# Patient Record
Sex: Female | Born: 1970 | Race: White | Hispanic: No | Marital: Married | Smoking: Never smoker
Health system: Southern US, Community
[De-identification: ages and names within clinical notes are randomized; demographics above are authoritative.]

## PROBLEM LIST (undated history)

## (undated) DIAGNOSIS — T7840XA Allergy, unspecified, initial encounter: Secondary | ICD-10-CM

## (undated) DIAGNOSIS — R011 Cardiac murmur, unspecified: Secondary | ICD-10-CM

## (undated) DIAGNOSIS — E559 Vitamin D deficiency, unspecified: Secondary | ICD-10-CM

## (undated) DIAGNOSIS — Z8619 Personal history of other infectious and parasitic diseases: Secondary | ICD-10-CM

## (undated) HISTORY — DX: Cardiac murmur, unspecified: R01.1

## (undated) HISTORY — DX: Vitamin D deficiency, unspecified: E55.9

## (undated) HISTORY — DX: Allergy, unspecified, initial encounter: T78.40XA

## (undated) HISTORY — DX: Personal history of other infectious and parasitic diseases: Z86.19

---

## 2015-04-23 LAB — HM MAMMOGRAPHY

## 2015-06-27 LAB — HM PAP SMEAR

## 2016-06-19 ENCOUNTER — Telehealth: Payer: Self-pay | Admitting: Family Medicine

## 2016-06-19 NOTE — Telephone Encounter (Signed)
Called Mrs. Ramires to schedule appt.  She said she was driving at the time and did not have her calendar available.  She will call back this afternoon to schedule appt.

## 2016-06-19 NOTE — Telephone Encounter (Signed)
Dr Gala RomneyBensimhon reached out to me asking if we would be able to see this pt as a new pt.  He cares for the husband and they just recently moved to town.  Please reach out to her and get her scheduled to establish.  Thanks!

## 2016-06-23 NOTE — Telephone Encounter (Signed)
Pt has been scheduled.  °

## 2016-06-25 ENCOUNTER — Telehealth: Payer: Self-pay | Admitting: Emergency Medicine

## 2016-06-25 ENCOUNTER — Encounter: Payer: Self-pay | Admitting: Emergency Medicine

## 2016-06-25 NOTE — Telephone Encounter (Signed)
Pre-Visit Call completed with patient and chart updated.   Pre-Visit Info documented in Specialty Comments under Snap[employer]ot.    

## 2016-06-26 ENCOUNTER — Encounter: Payer: Self-pay | Admitting: Family Medicine

## 2016-06-26 ENCOUNTER — Ambulatory Visit (INDEPENDENT_AMBULATORY_CARE_PROVIDER_SITE_OTHER): Payer: BLUE CROSS/BLUE SHIELD | Admitting: Family Medicine

## 2016-06-26 VITALS — BP 118/80 | HR 61 | Temp 98.0°F | Resp 16 | Ht 70.75 in | Wt 154.4 lb

## 2016-06-26 DIAGNOSIS — Z23 Encounter for immunization: Secondary | ICD-10-CM | POA: Diagnosis not present

## 2016-06-26 DIAGNOSIS — Z1231 Encounter for screening mammogram for malignant neoplasm of breast: Secondary | ICD-10-CM | POA: Diagnosis not present

## 2016-06-26 DIAGNOSIS — R51 Headache: Secondary | ICD-10-CM | POA: Diagnosis not present

## 2016-06-26 DIAGNOSIS — R011 Cardiac murmur, unspecified: Secondary | ICD-10-CM

## 2016-06-26 DIAGNOSIS — R519 Headache, unspecified: Secondary | ICD-10-CM

## 2016-06-26 LAB — CBC WITH DIFFERENTIAL/PLATELET
BASOS ABS: 0 10*3/uL (ref 0.0–0.1)
Basophils Relative: 0.5 % (ref 0.0–3.0)
EOS PCT: 1.8 % (ref 0.0–5.0)
Eosinophils Absolute: 0.1 10*3/uL (ref 0.0–0.7)
HEMATOCRIT: 41.3 % (ref 36.0–46.0)
Hemoglobin: 14.1 g/dL (ref 12.0–15.0)
LYMPHS PCT: 49.6 % — AB (ref 12.0–46.0)
Lymphs Abs: 2.6 10*3/uL (ref 0.7–4.0)
MCHC: 34 g/dL (ref 30.0–36.0)
MCV: 92.9 fl (ref 78.0–100.0)
MONOS PCT: 6.7 % (ref 3.0–12.0)
Monocytes Absolute: 0.4 10*3/uL (ref 0.1–1.0)
NEUTROS ABS: 2.2 10*3/uL (ref 1.4–7.7)
Neutrophils Relative %: 41.4 % — ABNORMAL LOW (ref 43.0–77.0)
Platelets: 227 10*3/uL (ref 150.0–400.0)
RBC: 4.44 Mil/uL (ref 3.87–5.11)
RDW: 14.3 % (ref 11.5–15.5)
WBC: 5.3 10*3/uL (ref 4.0–10.5)

## 2016-06-26 LAB — TSH: TSH: 3.26 u[IU]/mL (ref 0.35–4.50)

## 2016-06-26 LAB — BASIC METABOLIC PANEL
BUN: 15 mg/dL (ref 6–23)
CHLORIDE: 109 meq/L (ref 96–112)
CO2: 28 mEq/L (ref 19–32)
Calcium: 9.3 mg/dL (ref 8.4–10.5)
Creatinine, Ser: 0.98 mg/dL (ref 0.40–1.20)
GFR: 65.88 mL/min (ref >60.00)
GLUCOSE: 90 mg/dL (ref 70–99)
POTASSIUM: 5.3 meq/L — AB (ref 3.5–5.1)
SODIUM: 141 meq/L (ref 135–145)

## 2016-06-26 NOTE — Progress Notes (Signed)
Pre visit review using our clinic review tool, if applicable. No additional management support is needed unless otherwise documented below in the visit note. 

## 2016-06-26 NOTE — Progress Notes (Signed)
   Subjective:    Patient ID: Michele Russo, female    DOB: October 21, 1970, 45 y.o.   MRN: [id number]  HPI New to establish.  Previous MD- in [place]  Heart murmur- pt was told that she has a heart murmur.  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  Denies CP, SOB.  Has never had an ECHO.  Doing bootcamp w/o difficulty.  No swelling of hands/feet  Head/neck pain- pt describes 'a numbing kind of pain' most notable when waking up in the AM.  Was told by previous MD that 'it's probably fibromyalgia'.  sxs will occur throughout the day.  Pt does not take tylenol or ibuprofen.  sxs started 'years' ago.  Head is not TTP.  Denies lightning or shooting pains.  'it's not a headache'.  No N/V, visual changes.  sxs improve w/ increased hydration.  Review of Systems For ROS see HPI     Objective:   Physical Exam  Constitutional: She is oriented to person, place, and time. She appears well-developed and well-nourished. No distress.  HENT:  Head: Normocephalic and atraumatic.  Eyes: Conjunctivae and EOM are normal. Pupils are equal, round, and reactive to light.  Neck: Normal range of motion. Neck supple. No thyromegaly present.  Cardiovascular: Normal rate, regular rhythm, normal heart sounds and intact distal pulses.   No murmur heard. Pulmonary/Chest: Effort normal and breath sounds normal. No respiratory distress.  Abdominal: Soft. She exhibits no distension. There is no tenderness.  Musculoskeletal: She exhibits no edema.  Lymphadenopathy:    She has no cervical adenopathy.  Neurological: She is alert and oriented to person, place, and time. She has normal reflexes. No cranial nerve deficit. Coordination normal.  Skin: Skin is warm and dry.  Psychiatric: She has a normal mood and affect. Her behavior is normal.  Vitals reviewed.         Assessment & Plan:  Heart murmur- pt was told she had this previously but never had a work up.  I do not hear any murmur today and I listened w/ her  both sitting and lying down.  Asymptomatic.  Check labs to r/o anemia, thyroid, BMP as possible cause of murmur but no further evaluation needed  Pressure in head- pt reports this has been going on for years and is most notable when she wakes.  Reports that sxs improve w/ increased water intake.  Due to fact it occurs when waking, will get xray to assess cervical spine and see if there are any degenerative changes or positional issues.  Normal neuro exam today.  Encouraged pt to increase her water intake and see if sxs improve.  Reviewed supportive care and red flags that should prompt return.  Pt expressed understanding and is in agreement w/ plan.

## 2016-06-26 NOTE — Patient Instructions (Addendum)
Schedule your complete medical physical in 6 months We'll notify you of today's lab results and make any changes if needed We'll call you with your mammogram appt Go [address] (across from [facility]) to get your xray done (no appt needed- enter building and go to ground floor) Keep up the good work on healthy diet and regular exercise- you look great!!! Increase your water intake and monitor if head pressure improves Call with any questions or concerns Welcome!!  We're glad to have you!!!

## 2016-06-27 ENCOUNTER — Encounter: Payer: Self-pay | Admitting: General Practice

## 2016-07-07 ENCOUNTER — Encounter: Payer: Self-pay | Admitting: General Practice

## 2016-07-14 ENCOUNTER — Ambulatory Visit (INDEPENDENT_AMBULATORY_CARE_PROVIDER_SITE_OTHER)
Admission: RE | Admit: 2016-07-14 | Discharge: 2016-07-14 | Disposition: A | Discharge status: Home / Self Care | Payer: BLUE CROSS/BLUE SHIELD | Source: Ambulatory Visit | Attending: Family Medicine | Admitting: Family Medicine

## 2016-07-14 ENCOUNTER — Encounter: Payer: Self-pay | Admitting: Obstetrics and Gynecology

## 2016-07-14 ENCOUNTER — Ambulatory Visit (INDEPENDENT_AMBULATORY_CARE_PROVIDER_SITE_OTHER): Payer: BLUE CROSS/BLUE SHIELD | Admitting: Obstetrics and Gynecology

## 2016-07-14 VITALS — BP 112/60 | HR 60 | Resp 14 | Ht 70.5 in | Wt 158.0 lb

## 2016-07-14 DIAGNOSIS — Z0000 Encounter for general adult medical examination without abnormal findings: Secondary | ICD-10-CM | POA: Diagnosis not present

## 2016-07-14 DIAGNOSIS — Z124 Encounter for screening for malignant neoplasm of cervix: Secondary | ICD-10-CM

## 2016-07-14 DIAGNOSIS — E559 Vitamin D deficiency, unspecified: Secondary | ICD-10-CM | POA: Diagnosis not present

## 2016-07-14 DIAGNOSIS — R51 Headache: Secondary | ICD-10-CM | POA: Diagnosis not present

## 2016-07-14 DIAGNOSIS — Z01419 Encounter for gynecological examination (general) (routine) without abnormal findings: Secondary | ICD-10-CM | POA: Diagnosis not present

## 2016-07-14 DIAGNOSIS — R519 Headache, unspecified: Secondary | ICD-10-CM

## 2016-07-14 LAB — LIPID PANEL
CHOL/HDL RATIO: 2.4 ratio (ref <=5.0)
CHOLESTEROL: 158 mg/dL (ref 125–200)
HDL: 65 mg/dL (ref >=46)
LDL CALC: 70 mg/dL (ref <130)
TRIGLYCERIDES: 116 mg/dL (ref <150)
VLDL: 23 mg/dL (ref <30)

## 2016-07-14 LAB — CBC
HCT: 42.2 % (ref 35.0–45.0)
Hemoglobin: 13.8 g/dL (ref 11.7–15.5)
MCH: 31.3 pg (ref 27.0–33.0)
MCHC: 32.7 g/dL (ref 32.0–36.0)
MCV: 95.7 fL (ref 80.0–100.0)
MPV: 11.2 fL (ref 7.5–12.5)
PLATELETS: 272 10*3/uL (ref 140–400)
RBC: 4.41 MIL/uL (ref 3.80–5.10)
RDW: 14 % (ref 11.0–15.0)
WBC: 7.4 10*3/uL (ref 3.8–10.8)

## 2016-07-14 LAB — COMPREHENSIVE METABOLIC PANEL
ALBUMIN: 4.3 g/dL (ref 3.6–5.1)
ALT: 17 U/L (ref 6–29)
AST: 15 U/L (ref 10–30)
Alkaline Phosphatase: 56 U/L (ref 33–115)
BUN: 10 mg/dL (ref 7–25)
CHLORIDE: 103 mmol/L (ref 98–110)
CO2: 25 mmol/L (ref 20–31)
Calcium: 9.5 mg/dL (ref 8.6–10.2)
Creat: 0.89 mg/dL (ref 0.50–1.10)
Glucose, Bld: 80 mg/dL (ref 65–99)
POTASSIUM: 4.6 mmol/L (ref 3.5–5.3)
SODIUM: 138 mmol/L (ref 135–146)
TOTAL PROTEIN: 7.1 g/dL (ref 6.1–8.1)
Total Bilirubin: 0.6 mg/dL (ref 0.2–1.2)

## 2016-07-14 LAB — HM MAMMOGRAPHY

## 2016-07-14 NOTE — Patient Instructions (Signed)

## 2016-07-14 NOTE — Progress Notes (Signed)
45 y.o. [id number] MarriedCaucasianF here for annual exam.   Period Cycle (Days): 28 Period Duration (Days): 4-5 days  Period Pattern: Regular Menstrual Flow: Moderate, Heavy Menstrual Control: Tampon Dysmenorrhea: None  Saturates a super tampon in 3 hours. Slight cramping.  Sexually active, vasectomy for contraception. No dyspareunia.   Patient's last menstrual period was 07/12/2016.          Sexually active: Yes.    The current method of family planning is vasectomy.    Exercising: Yes.    boot camp/ weights/ cardio Smoker:  no  Health Maintenance: Pap:  02/2015 WNL per patient  History of abnormal Pap:  no MMG:  02/2015 - U/S WNL Colonoscopy:  Never BMD:   Never TDaP:  06-26-09 Gardasil: N/A   reports that she has never smoked. She has never used smokeless tobacco. She reports that she drinks about 0.6 oz of alcohol per week . She reports that she does not use drugs.  Moved here 3 months ago. Oldest daughter just got married, younger daughter is in college. She is currently working. Quit her job when she moved here.   Past Medical History:  Diagnosis Date  . Allergy   . Heart murmur   . History of chicken pox   . History of UTI   Vit D def  Past Surgical History:  Procedure Laterality Date  . CESAREAN SECTION    x1, first was a normal SVD  No current outpatient prescriptions on file.   No current facility-administered medications for this visit.     Family History  Problem Relation Age of Onset  . Osteoporosis Maternal Grandmother   . Heart disease Maternal Grandfather   . Osteoporosis Paternal Grandmother   . Alcohol abuse Father   . Liver disease Father     Review of Systems  Constitutional: Negative.   HENT: Negative.   Eyes: Negative.   Respiratory: Negative.   Cardiovascular: Negative.   Gastrointestinal: Negative.   Endocrine: Negative.   Genitourinary: Negative.   Musculoskeletal: Negative.   Skin: Negative.   Allergic/Immunologic: Negative.    Neurological: Negative.   Psychiatric/Behavioral: Negative.     Exam:   BP 112/60 (BP Location: Right Arm, Patient Position: Sitting, Cuff Size: Normal)   Pulse 60   Resp 14   Ht 5' 10.5" (1.791 m)   Wt 158 lb (71.7 kg)   LMP 07/12/2016   BMI 22.35 kg/m   Weight change: @WEIGHTCHANGE @ Height:   Height: 5' 10.5" (179.1 cm)  Ht Readings from Last 3 Encounters:  07/14/16 5' 10.5" (1.791 m)  06/26/16 5' 10.75" (1.797 m)    General appearance: alert, cooperative and appears stated age Head: Normocephalic, without obvious abnormality, atraumatic Neck: no adenopathy, supple, symmetrical, trachea midline and thyroid normal to inspection and palpation Lungs: clear to auscultation bilaterally Breasts: normal appearance, no masses or tenderness Heart: regular rate and rhythm Abdomen: soft, non-tender; bowel sounds normal; no masses,  no organomegaly Extremities: extremities normal, atraumatic, no cyanosis or edema Skin: Skin color, texture, turgor normal. No rashes or lesions. Slight increase in pigmentation lateral to the breast bilaterally and under the left breast.  Lymph nodes: Cervical, supraclavicular, and axillary nodes normal. No abnormal inguinal nodes palpated Neurologic: Grossly normal   Pelvic: External genitalia:  no lesions              Urethra:  normal appearing urethra with no masses, tenderness or lesions              Bartholins  and Skenes: normal                 Vagina: normal appearing vagina with normal color and discharge, no lesions              Cervix: no lesions               Bimanual Exam:  Uterus:  normal size, contour, position, consistency, mobility, non-tender              Adnexa: no mass, fullness, tenderness               Rectovaginal: Confirms               Anus:  normal sphincter tone, no lesions  Chaperone was present for exam.  A:  Well Woman with normal exam  H/O vit d def  P:   Mammogram today  Pap with hpv  Screening labs  today  Discussed breast self exam  Discussed calcium and vit D intake

## 2016-07-15 LAB — VITAMIN D 25 HYDROXY (VIT D DEFICIENCY, FRACTURES): Vit D, 25-Hydroxy: 23 ng/mL — ABNORMAL LOW (ref 30–100)

## 2016-07-16 LAB — IPS PAP TEST WITH HPV

## 2016-07-24 ENCOUNTER — Encounter: Payer: Self-pay | Admitting: General Practice

## 2016-08-21 ENCOUNTER — Telehealth: Payer: Self-pay | Admitting: Family Medicine

## 2016-08-21 NOTE — Telephone Encounter (Signed)
Patient notified of PCP recommendations and is agreement and expresses an understanding.  

## 2016-08-21 NOTE — Telephone Encounter (Signed)
She can either take 3-4 ibuprofen every 8 hrs or 2 aleve twice daily- either way, take w/ food and we'll evaluate her tomorrow

## 2016-08-21 NOTE — Telephone Encounter (Signed)
Pt states that she was moving some furniture and pulled her back. Pt is unsure on what she needs to do. I have scheduled her for tomorrow morning, but pt would like to know what she can do for tonight and wants a call back.

## 2016-08-22 ENCOUNTER — Encounter: Payer: Self-pay | Admitting: Family Medicine

## 2016-08-22 ENCOUNTER — Ambulatory Visit (INDEPENDENT_AMBULATORY_CARE_PROVIDER_SITE_OTHER): Payer: BLUE CROSS/BLUE SHIELD | Admitting: Family Medicine

## 2016-08-22 VITALS — BP 116/72 | HR 64 | Temp 98.1°F | Resp 17 | Ht 71.0 in | Wt 157.1 lb

## 2016-08-22 DIAGNOSIS — M545 Low back pain, unspecified: Secondary | ICD-10-CM

## 2016-08-22 MED ORDER — CYCLOBENZAPRINE HCL 10 MG PO TABS: 10.0000 mg | ORAL_TABLET | Freq: Three times a day (TID) | ORAL | 0 refills | Status: DC | PRN

## 2016-08-22 MED ORDER — PREDNISONE 10 MG PO TABS: ORAL_TABLET | ORAL | 0 refills | Status: DC

## 2016-08-22 NOTE — Progress Notes (Signed)
   Subjective:    Patient ID: Michele Russo, female    DOB: 1970/10/22, 45 y.o.   MRN: [id number]  HPI LBP- 2 days ago was moving 2 recliners in the house and had no difficulty.  Next morning she bent over to pick something up off the floor and 'i could hear my back snap, crackle, pop'.  It dropped pt to her knees.  + nausea.  Pt has to stand and sit very upright for pain relief.  Used heating pad yesterday.  Did not take medication b/c she prefers not to.  Slept w/ legs elevated.  Again had nausea when she got out of bed today.     Review of Systems For ROS see HPI     Objective:   Physical Exam  Constitutional: She is oriented to person, place, and time. She appears well-developed and well-nourished. No distress.  HENT:  Head: Normocephalic and atraumatic.  Cardiovascular: Intact distal pulses.   Musculoskeletal: She exhibits tenderness (TTP over PSIS bilaterally.  no spinal/midline TTP.  able to flex and extend although gingerly.  pain w/ rotation of spine).  Neurological: She is alert and oriented to person, place, and time. She has normal reflexes. Coordination normal.  Skin: Skin is warm and dry. No rash noted. No erythema.  Psychiatric: She has a normal mood and affect. Her behavior is normal. Thought content normal.  Vitals reviewed.         Assessment & Plan:  LBP- new.  Suspect muscular pain given lack of red flags on hx or PE.  Start Prednisone taper and flexeril prn.  Reviewed supportive care and red flags that should prompt return.  Pt expressed understanding and is in agreement w/ plan.

## 2016-08-22 NOTE — Patient Instructions (Signed)
Follow up as needed Start the Prednisone as directed- 3 pills at the same time x3 days, then 2 pills x3 days, and then 1 pill daily (take w/ food) HEAT! Use the flexeril at bedtime Try and do some gentle stretching to prevent worsening stiffness Call with any questions or concerns Hang in there! Happy Holidays!!!

## 2016-08-22 NOTE — Progress Notes (Signed)
Pre visit review using our clinic review tool, if applicable. No additional management support is needed unless otherwise documented below in the visit note. 

## 2016-08-25 ENCOUNTER — Telehealth: Payer: Self-pay | Admitting: Family Medicine

## 2016-08-25 NOTE — Telephone Encounter (Signed)
Patient notified of PCP recommendations and is agreement and expresses an understanding.  

## 2016-08-25 NOTE — Telephone Encounter (Signed)
She was given prednisone 3 days ago on 11/17 and should have 6 days left.  These are the best available anti-inflammatories.  Add tylenol as needed.  Take the Flexeril for spasm (may cause drowsiness)

## 2016-08-25 NOTE — Telephone Encounter (Signed)
Pt states that she has pulled on her back again [place] and asking if there is some kind of anti inflammatory that she could take during the day to help with the pain, [facility] in [place]

## 2016-09-02 ENCOUNTER — Telehealth: Payer: Self-pay | Admitting: Family Medicine

## 2016-09-02 DIAGNOSIS — M545 Low back pain: Secondary | ICD-10-CM

## 2016-09-02 NOTE — Telephone Encounter (Signed)
Recommend either PT referral for home exercise program or Ortho (Ramos)

## 2016-09-02 NOTE — Telephone Encounter (Signed)
Pt states that she is still having issues with her back, thinking that maybe its a pinch nerve and asking if there is any exercises that she could do.

## 2016-09-02 NOTE — Telephone Encounter (Signed)
Spoke with pt and referral to Dr> Ramos placed at her preference.

## 2016-10-29 ENCOUNTER — Telehealth: Payer: Self-pay | Admitting: Family Medicine

## 2016-10-29 NOTE — Telephone Encounter (Signed)
Patient Name: Michele Russo DOB: 03-02-1971 Initial Comment Caller states she thinks she has tennis elbow, and now the pain has moved into her shoulder. Right arm. She does get a tightness in her chest. Nurse Assessment Nurse: Charna Elizabethrumbull, RN, Lynden Angathy Date/Time (Eastern Time): 10/29/2016 9:07:16 AM Confirm and document reason for call. If symptomatic, describe symptoms. ---Caller states she has had chest tightness that goes into her right shoulder/arm on and off the past 3-4 weeks that is present again today (rated as a 4 on the 1 to 10 scale). No severe breathing difficulty. Alert and responsive. No known injury. Does the patient have any new or worsening symptoms? ---Yes Will a triage be completed? ---Yes Related visit to physician within the last 2 weeks? ---No Does the PT have any chronic conditions? (i.e. diabetes, asthma, etc.) ---Yes List chronic conditions. ---Tennis Elbow?, Heart Murmur?, Is the patient pregnant or possibly pregnant? (Ask all females between the ages of 8712-55) ---No Is this a behavioral health or substance abuse call? ---No Guidelines Guideline Title Affirmed Question Affirmed Notes Chest Pain [1] Intermittent chest pain or "angina" AND [2] increasing in severity or frequency (Exception: pains lasting a few seconds) Final Disposition User Go to ED Now Charna Elizabethrumbull, RN, [employer]athy Comments [name] declined the Go to ER disposition. Reinforced the Go to ER disposition. [name] Batheresa asks to see Dr. Beverely Lowabori. Called the office and Sue Lushndrea states that MD or MD assistant will call her back. [name] notified. Referrals GO TO FACILITY REFUSED Disagree/Comply: Disagree Disagree/Comply Reason: Disagree with instructions

## 2016-10-29 NOTE — Telephone Encounter (Signed)
Michele MessierKathy with [facility] calling to notify pcp that patient has declined their instructions to report to ER for chest tightness with shoulder and arm pain.

## 2016-10-29 NOTE — Telephone Encounter (Signed)
Spoke with patient regarding symptoms. Patient reports occasional tightness in chest x 3-4 weeks at night while resting. Episodes last less than 30 seconds, denies shortness of breath. Patient is training for a marathon, therefore is running 6-8 miles/day, denies chest pain/discomfort while running. Patient has also experienced right elbow/arm discomfort x 2 months, no history of injury. Patient does not feel this is an emergent situation and is requesting an appointment with PCP for evaluation. Appointment scheduled for 10/30/2016 with PCP.

## 2016-10-29 NOTE — Telephone Encounter (Signed)
Can you please call pt and find out if it is more chest pain (heart issues) or the tennis elbow that they also talk about.

## 2016-10-30 ENCOUNTER — Encounter: Payer: Self-pay | Admitting: Family Medicine

## 2016-10-30 ENCOUNTER — Ambulatory Visit (INDEPENDENT_AMBULATORY_CARE_PROVIDER_SITE_OTHER): Payer: BLUE CROSS/BLUE SHIELD | Admitting: Family Medicine

## 2016-10-30 VITALS — BP 118/81 | HR 69 | Temp 98.1°F | Resp 16 | Ht 71.0 in | Wt 160.5 lb

## 2016-10-30 DIAGNOSIS — R0789 Other chest pain: Secondary | ICD-10-CM | POA: Diagnosis not present

## 2016-10-30 DIAGNOSIS — M7711 Lateral epicondylitis, right elbow: Secondary | ICD-10-CM | POA: Diagnosis not present

## 2016-10-30 MED ORDER — CYCLOBENZAPRINE HCL 10 MG PO TABS
10.0000 mg | ORAL_TABLET | Freq: Three times a day (TID) | ORAL | 0 refills | Status: DC | PRN
Start: 2016-10-30 — End: 2016-12-22

## 2016-10-30 MED ORDER — PREDNISONE 10 MG PO TABS: ORAL_TABLET | ORAL | 0 refills | Status: DC

## 2016-10-30 NOTE — Patient Instructions (Signed)
Follow up by phone or MyChart if no better in 7-10 days or worsening Start the Prednisone as directed- take w/ food Use the Flexeril prior to bed- will cause drowsiness ICE the elbow!!! Make sure you stretch your arms and chest prior to running Call with any questions or concerns- especially if we need the ortho referral Hang in there!!

## 2016-10-30 NOTE — Progress Notes (Signed)
Pre visit review using our clinic review tool, if applicable. No additional management support is needed unless otherwise documented below in the visit note. 

## 2016-10-30 NOTE — Progress Notes (Signed)
   Subjective:    Patient ID: Michele Russo, female    DOB: [birth year], 46 y.o.   MRN: [id number]  HPI Chest pain- sxs started ~4 weeks ago.  Described as a tightness of upper chest across both sides.  Occurs at night while resting.  No precipitating factors.  No TTP, no pain w/ exertion (training for marathon), no SOB.  Hx of cardiac murmur.  No discomfort after eating.  R arm pain- starts at elbow, radiates down medial aspect of arm.  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  Pain is now radiating up into shoulder and down R side of chest   Review of Systems For ROS see HPI     Objective:   Physical Exam  Constitutional: She is oriented to person, place, and time. She appears well-developed and well-nourished. No distress.  HENT:  Head: Normocephalic and atraumatic.  Eyes: Conjunctivae and EOM are normal. Pupils are equal, round, and reactive to light.  Neck: Normal range of motion. Neck supple. No thyromegaly present.  Cardiovascular: Normal rate, regular rhythm, normal heart sounds and intact distal pulses.   No murmur heard. Pulmonary/Chest: Effort normal and breath sounds normal. No respiratory distress.  Abdominal: Soft. She exhibits no distension. There is no tenderness.  Musculoskeletal: She exhibits tenderness (TTP over R lateral epicondyle and insertion of pecs bilaterally). She exhibits no edema.  Lymphadenopathy:    She has no cervical adenopathy.  Neurological: She is alert and oriented to person, place, and time.  Skin: Skin is warm and dry.  Psychiatric: She has a normal mood and affect. Her behavior is normal.  Vitals reviewed.         Assessment & Plan:  R lateral epicondylitis- new.  Pt's R arm pain and PE consistent w/ inflammation.  Start Prednisone as pt is allergic to NSAIDs.  Reviewed dx, need for rest and icing.  If no improvement will need ortho referral.  Pt expressed understanding and is in agreement w/ plan.   Chest pain- new.  Pt's EKG  WNL and her pain does not occur w/ exertion.  Her sxs are more musculoskeletal w/ pain of pec insertion bilaterally and across upper chest in bandlike fashion.  Prednisone should help.  I suspect this is due to her recent increase in mileage.  Reviewed supportive care and red flags that should prompt return.  Pt expressed understanding and is in agreement w/ plan.

## 2016-11-03 ENCOUNTER — Telehealth: Payer: Self-pay | Admitting: Family Medicine

## 2016-11-03 NOTE — Telephone Encounter (Signed)
Mucinex DM will help w/ cough and congestion Daily Claritin/Zyrtec will help w/ possible allergy component

## 2016-11-03 NOTE — Telephone Encounter (Signed)
Pt states that she is having some sinus issues and asking what could take otc to help take care of this.

## 2016-11-03 NOTE — Telephone Encounter (Signed)
Patient notified of PCP recommendations and is agreement and expresses an understanding.  

## 2016-12-22 ENCOUNTER — Encounter: Payer: Self-pay | Admitting: Family Medicine

## 2016-12-22 ENCOUNTER — Ambulatory Visit (INDEPENDENT_AMBULATORY_CARE_PROVIDER_SITE_OTHER): Payer: BLUE CROSS/BLUE SHIELD | Admitting: Family Medicine

## 2016-12-22 DIAGNOSIS — Z0000 Encounter for general adult medical examination without abnormal findings: Secondary | ICD-10-CM | POA: Insufficient documentation

## 2016-12-22 LAB — BASIC METABOLIC PANEL
BUN: 12 mg/dL (ref 6–23)
CALCIUM: 9.6 mg/dL (ref 8.4–10.5)
CO2: 27 mEq/L (ref 19–32)
Chloride: 105 mEq/L (ref 96–112)
Creatinine, Ser: 0.83 mg/dL (ref 0.40–1.20)
GFR: 79.61 mL/min (ref >60.00)
GLUCOSE: 82 mg/dL (ref 70–99)
Potassium: 4.6 mEq/L (ref 3.5–5.1)
SODIUM: 139 meq/L (ref 135–145)

## 2016-12-22 LAB — LIPID PANEL
Cholesterol: 134 mg/dL (ref 0–200)
HDL: 71 mg/dL (ref >39.00)
LDL CALC: 49 mg/dL (ref 0–99)
NONHDL: 63.49
Total CHOL/HDL Ratio: 2
Triglycerides: 70 mg/dL (ref 0.0–149.0)
VLDL: 14 mg/dL (ref 0.0–40.0)

## 2016-12-22 LAB — HEPATIC FUNCTION PANEL
ALT: 12 U/L (ref 0–35)
AST: 14 U/L (ref 0–37)
Albumin: 4.4 g/dL (ref 3.5–5.2)
Alkaline Phosphatase: 46 U/L (ref 39–117)
BILIRUBIN TOTAL: 0.6 mg/dL (ref 0.2–1.2)
Bilirubin, Direct: 0.1 mg/dL (ref 0.0–0.3)
Total Protein: 6.9 g/dL (ref 6.0–8.3)

## 2016-12-22 LAB — CBC WITH DIFFERENTIAL/PLATELET
BASOS PCT: 0.7 % (ref 0.0–3.0)
Basophils Absolute: 0 10*3/uL (ref 0.0–0.1)
EOS ABS: 0.3 10*3/uL (ref 0.0–0.7)
Eosinophils Relative: 5.2 % — ABNORMAL HIGH (ref 0.0–5.0)
HEMATOCRIT: 40.6 % (ref 36.0–46.0)
HEMOGLOBIN: 13.3 g/dL (ref 12.0–15.0)
Lymphocytes Relative: 41 % (ref 12.0–46.0)
Lymphs Abs: 2.1 10*3/uL (ref 0.7–4.0)
MCHC: 32.7 g/dL (ref 30.0–36.0)
MCV: 94.8 fl (ref 78.0–100.0)
Monocytes Absolute: 0.4 10*3/uL (ref 0.1–1.0)
Monocytes Relative: 7.6 % (ref 3.0–12.0)
Neutro Abs: 2.4 10*3/uL (ref 1.4–7.7)
Neutrophils Relative %: 45.5 % (ref 43.0–77.0)
Platelets: 224 10*3/uL (ref 150.0–400.0)
RBC: 4.28 Mil/uL (ref 3.87–5.11)
RDW: 14.4 % (ref 11.5–15.5)
WBC: 5.2 10*3/uL (ref 4.0–10.5)

## 2016-12-22 LAB — VITAMIN D 25 HYDROXY (VIT D DEFICIENCY, FRACTURES): VITD: 23.8 ng/mL — ABNORMAL LOW (ref 30.00–100.00)

## 2016-12-22 LAB — TSH: TSH: 6.16 u[IU]/mL — ABNORMAL HIGH (ref 0.35–4.50)

## 2016-12-22 NOTE — Assessment & Plan Note (Signed)
Pt's PE WNL.  UTD on GYN and immunizations.  Check labs.  Anticipatory guidance provided.  

## 2016-12-22 NOTE — Patient Instructions (Signed)
Follow up in 1 year or as needed We'll notify you of your lab results and make any changes if needed Continue to work on healthy diet and regular exercise- you look great! Call with any questions or concerns Happy Spring!!! 

## 2016-12-22 NOTE — Progress Notes (Signed)
   Subjective:    Patient ID: Michele Russo, female    DOB: January 16, 1971, 46 y.o.   MRN: [id number]  HPI CPE- UTD on pap, mammo.  UTD on Flu, Tdap.  No concerns today.   Review of Systems Patient reports no vision/ hearing changes, adenopathy,fever, weight change,  persistant/recurrent hoarseness , swallowing issues, chest pain, palpitations, edema, persistant/recurrent cough, hemoptysis, dyspnea (rest/exertional/paroxysmal nocturnal), gastrointestinal bleeding (melena, rectal bleeding), abdominal pain, significant heartburn, bowel changes, GU symptoms (dysuria, hematuria, incontinence), Gyn symptoms (abnormal  bleeding, pain),  syncope, focal weakness, memory loss, numbness & tingling, skin/hair/nail changes, abnormal bruising or bleeding, anxiety, or depression.     Objective:   Physical Exam General Appearance:    Alert, cooperative, no distress, appears stated age  Head:    Normocephalic, without obvious abnormality, atraumatic  Eyes:    PERRL, conjunctiva/corneas clear, EOM's intact, fundi    benign, both eyes  Ears:    Normal TM's and external ear canals, both ears  Nose:   Nares normal, septum midline, mucosa normal, no drainage    or sinus tenderness  Throat:   Lips, mucosa, and tongue normal; teeth and gums normal  Neck:   Supple, symmetrical, trachea midline, no adenopathy;    Thyroid: no enlargement/tenderness/nodules  Back:     Symmetric, no curvature, ROM normal, no CVA tenderness  Lungs:     Clear to auscultation bilaterally, respirations unlabored  Chest Wall:    No tenderness or deformity   Heart:    Regular rate and rhythm, S1 and S2 normal, no murmur, rub   or gallop  Breast Exam:    Deferred to GYN  Abdomen:     Soft, non-tender, bowel sounds active all four quadrants,    no masses, no organomegaly  Genitalia:    Deferred to GYN  Rectal:    Extremities:   Extremities normal, atraumatic, no cyanosis or edema  Pulses:   2+ and symmetric all extremities  Skin:   Skin  color, texture, turgor normal, no rashes or lesions  Lymph nodes:   Cervical, supraclavicular, and axillary nodes normal  Neurologic:   CNII-XII intact, normal strength, sensation and reflexes    throughout          Assessment & Plan:

## 2016-12-22 NOTE — Progress Notes (Signed)
Pre visit review using our clinic review tool, if applicable. No additional management support is needed unless otherwise documented below in the visit note. 

## 2016-12-23 ENCOUNTER — Other Ambulatory Visit: Payer: Self-pay | Admitting: General Practice

## 2016-12-23 DIAGNOSIS — R7989 Other specified abnormal findings of blood chemistry: Secondary | ICD-10-CM

## 2016-12-23 MED ORDER — VITAMIN D (ERGOCALCIFEROL) 1.25 MG (50000 UNIT) PO CAPS: 50000.0000 [IU] | ORAL_CAPSULE | ORAL | 0 refills | Status: DC

## 2016-12-23 MED ORDER — LEVOTHYROXINE SODIUM 50 MCG PO TABS: 50.0000 ug | ORAL_TABLET | Freq: Every day | ORAL | 1 refills | Status: DC

## 2017-01-26 ENCOUNTER — Other Ambulatory Visit (INDEPENDENT_AMBULATORY_CARE_PROVIDER_SITE_OTHER): Payer: BLUE CROSS/BLUE SHIELD

## 2017-01-26 ENCOUNTER — Telehealth: Payer: Self-pay | Admitting: Family Medicine

## 2017-01-26 DIAGNOSIS — R7989 Other specified abnormal findings of blood chemistry: Secondary | ICD-10-CM

## 2017-01-26 DIAGNOSIS — R946 Abnormal results of thyroid function studies: Secondary | ICD-10-CM

## 2017-01-26 LAB — TSH: TSH: 2.25 u[IU]/mL (ref 0.35–4.50)

## 2017-01-26 NOTE — Telephone Encounter (Signed)
That would be great!  OTC Glucosamine is perfectly safe

## 2017-01-26 NOTE — Telephone Encounter (Signed)
Called pt and left a detailed message to inform that PCP agrees with taking the glucosamine.

## 2017-01-26 NOTE — Telephone Encounter (Signed)
Patient states she has been experiencing joint pain since starting levothyroxine (SYNTHROID, LEVOTHROID) 50 MCG tablet.  She is considering starting otc glucosamin supplements to help with the pain.  She wants to know if pcp thinks this would be ok.

## 2017-01-27 ENCOUNTER — Encounter: Payer: Self-pay | Admitting: Family Medicine

## 2017-02-23 ENCOUNTER — Encounter: Payer: Self-pay | Admitting: Family Medicine

## 2017-02-23 ENCOUNTER — Ambulatory Visit (INDEPENDENT_AMBULATORY_CARE_PROVIDER_SITE_OTHER): Payer: BLUE CROSS/BLUE SHIELD | Admitting: Family Medicine

## 2017-02-23 VITALS — BP 120/80 | HR 71 | Temp 98.5°F | Resp 16 | Ht 71.0 in | Wt 159.1 lb

## 2017-02-23 DIAGNOSIS — L439 Lichen planus, unspecified: Secondary | ICD-10-CM | POA: Diagnosis not present

## 2017-02-23 DIAGNOSIS — J301 Allergic rhinitis due to pollen: Secondary | ICD-10-CM

## 2017-02-23 NOTE — Progress Notes (Signed)
Pre visit review using our clinic review tool, if applicable. No additional management support is needed unless otherwise documented below in the visit note. 

## 2017-02-23 NOTE — Assessment & Plan Note (Signed)
New.  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  Will check ANA and determine if rheum referral is needed.  Pt expressed understanding and is in agreement w/ plan.

## 2017-02-23 NOTE — Patient Instructions (Signed)
Follow up as needed Start daily Zyrtec to help w/ postnasal drip and allergy congestion/cough Drink plenty of fluids REST! Mucinex DM will help w/ cough and congestion Call with any questions or concerns Hang in there!!!

## 2017-02-23 NOTE — Progress Notes (Signed)
   Subjective:    Patient ID: Michele Russo, female    DOB: Oct 31, 1970, 46 y.o.   MRN: [id number]  HPI URI- pt was visiting [place] last week and developed watery eyes, sore throat, constantly clearing her throat.  + cough- 'all night long'.  Attempted to sleep upright due to drainage.  Pt has sensation of water in L ear.  Pt reports 'I feel fine'.  Pt is not taking any OTC medication.     Review of Systems For ROS see HPI     Objective:   Physical Exam  Constitutional: She is oriented to person, place, and time. She appears well-developed and well-nourished. No distress.  HENT:  Head: Normocephalic and atraumatic.  Right Ear: Tympanic membrane normal.  Left Ear: Tympanic membrane normal.  Nose: Mucosal edema and rhinorrhea present. Right sinus exhibits no maxillary sinus tenderness and no frontal sinus tenderness. Left sinus exhibits no maxillary sinus tenderness and no frontal sinus tenderness.  Mouth/Throat: Mucous membranes are normal. Posterior oropharyngeal erythema (w/ PND) present.  Eyes: Conjunctivae and EOM are normal. Pupils are equal, round, and reactive to light.  Neck: Normal range of motion. Neck supple.  Cardiovascular: Normal rate, regular rhythm and normal heart sounds.   Pulmonary/Chest: Effort normal and breath sounds normal. No respiratory distress. She has no wheezes. She has no rales.  Lymphadenopathy:    She has no cervical adenopathy.  Neurological: She is alert and oriented to person, place, and time.  Skin: Skin is warm and dry.  Psychiatric: She has a normal mood and affect. Her behavior is normal. Thought content normal.  Vitals reviewed.         Assessment & Plan:

## 2017-02-23 NOTE — Assessment & Plan Note (Signed)
New.  Pt's sxs and PE consistent w/ seasonal allergies.  No evidence of infxn so no abx needed.  Start daily antihistamine.  Reviewed supportive care and red flags that should prompt return.  Pt expressed understanding and is in agreement w/ plan.

## 2017-02-24 ENCOUNTER — Encounter: Payer: Self-pay | Admitting: Family Medicine

## 2017-02-24 LAB — ANA: Anti Nuclear Antibody(ANA): NEGATIVE

## 2017-03-19 ENCOUNTER — Other Ambulatory Visit: Payer: Self-pay | Admitting: Family Medicine

## 2017-03-23 ENCOUNTER — Encounter: Payer: Self-pay | Admitting: Family Medicine

## 2017-03-23 ENCOUNTER — Ambulatory Visit (INDEPENDENT_AMBULATORY_CARE_PROVIDER_SITE_OTHER): Payer: BLUE CROSS/BLUE SHIELD | Admitting: Family Medicine

## 2017-03-23 VITALS — BP 112/82 | HR 59 | Temp 98.1°F | Resp 16 | Ht 71.0 in | Wt 159.0 lb

## 2017-03-23 DIAGNOSIS — R233 Spontaneous ecchymoses: Secondary | ICD-10-CM | POA: Diagnosis not present

## 2017-03-23 DIAGNOSIS — J029 Acute pharyngitis, unspecified: Secondary | ICD-10-CM

## 2017-03-23 LAB — POCT RAPID STREP A (OFFICE): RAPID STREP A SCREEN: NEGATIVE

## 2017-03-23 NOTE — Patient Instructions (Signed)
Follow up as needed/scheduled This appears to be a viral illness and should get better with time If you develop fever, blisters, rashes on hands/feet or other concerns- please let me know! Alternate tylenol/ibuprofen as needed for sore throat Call with any questions or concerns Hang in there! Enjoy your family visit!

## 2017-03-23 NOTE — Addendum Note (Signed)
Addended by: Geannie RisenBRODMERKEL, JESSICA L on: 03/23/2017 04:29 PM   Modules accepted: Orders

## 2017-03-23 NOTE — Progress Notes (Signed)
   Subjective:    Patient ID: Michele Russo, female    DOB: 1970-11-03, 46 y.o.   MRN: [id number]  HPI Sore throat- pt reports sxs started a few days ago.  Last night noticed red spots on roof of mouth and uvula.  No fevers.  No blisters on hands/feet, no bumps or rashes.  No known sick contacts.  Was around nephew 2 weeks ago but he was not known to be ill.   Review of Systems For ROS see HPI     Objective:   Physical Exam  Constitutional: She is oriented to person, place, and time. She appears well-developed and well-nourished. No distress.  HENT:  Head: Normocephalic and atraumatic.  Nose: Nose normal.  Mouth/Throat: No oropharyngeal exudate.  Eyes: Conjunctivae and EOM are normal. Pupils are equal, round, and reactive to light.  Neck: Normal range of motion. Neck supple.  Cardiovascular: Normal rate, regular rhythm and normal heart sounds.   Pulmonary/Chest: Effort normal and breath sounds normal. No respiratory distress. She has no wheezes. She has no rales.  Lymphadenopathy:    She has no cervical adenopathy.  Neurological: She is alert and oriented to person, place, and time.  Skin: Skin is warm and dry. No rash noted. No erythema.  Psychiatric: She has a normal mood and affect. Her behavior is normal. Thought content normal.  Vitals reviewed.         Assessment & Plan:  Palatal petechiae- new.  Pt w/ mild sore throat but no fevers or lymph adenopathy.  Strep negative.  Petechiae are not consistent w/ HFM but this is going around the community.  Suspect this is a viral illness and should resolve w/ time.  No need for abx at this time.  Reviewed supportive care and red flags that should prompt return.  Pt expressed understanding and is in agreement w/ plan.

## 2017-03-23 NOTE — Progress Notes (Signed)
Pre visit review using our clinic review tool, if applicable. No additional management support is needed unless otherwise documented below in the visit note. 

## 2017-04-23 ENCOUNTER — Other Ambulatory Visit: Payer: Self-pay | Admitting: Family Medicine

## 2017-04-23 ENCOUNTER — Telehealth: Payer: Self-pay | Admitting: Family Medicine

## 2017-04-23 NOTE — Telephone Encounter (Signed)
Pt informed that she should continue OTC vitamin D 2000iu daily.

## 2017-04-23 NOTE — Telephone Encounter (Signed)
Pt asking for a refill on Vit D 50,000 units, [facility] [address].

## 2017-07-16 ENCOUNTER — Encounter: Payer: Self-pay | Admitting: Family Medicine

## 2017-07-16 LAB — HM MAMMOGRAPHY

## 2017-07-21 ENCOUNTER — Encounter: Payer: Self-pay | Admitting: General Practice

## 2017-07-23 ENCOUNTER — Ambulatory Visit: Payer: BLUE CROSS/BLUE SHIELD | Admitting: Obstetrics and Gynecology

## 2017-08-11 ENCOUNTER — Ambulatory Visit (INDEPENDENT_AMBULATORY_CARE_PROVIDER_SITE_OTHER): Payer: BLUE CROSS/BLUE SHIELD

## 2017-08-11 DIAGNOSIS — Z23 Encounter for immunization: Secondary | ICD-10-CM | POA: Diagnosis not present

## 2017-08-19 ENCOUNTER — Encounter: Payer: Self-pay | Admitting: Obstetrics and Gynecology

## 2017-08-19 ENCOUNTER — Ambulatory Visit (INDEPENDENT_AMBULATORY_CARE_PROVIDER_SITE_OTHER): Payer: BLUE CROSS/BLUE SHIELD | Admitting: Obstetrics and Gynecology

## 2017-08-19 ENCOUNTER — Other Ambulatory Visit: Payer: Self-pay

## 2017-08-19 VITALS — BP 100/60 | HR 72 | Resp 14 | Ht 70.0 in | Wt 160.0 lb

## 2017-08-19 DIAGNOSIS — Z01419 Encounter for gynecological examination (general) (routine) without abnormal findings: Secondary | ICD-10-CM | POA: Diagnosis not present

## 2017-08-19 NOTE — Patient Instructions (Addendum)
EXERCISE AND DIET:  We recommended that you start or continue a regular exercise program for good health. Regular exercise means any activity that makes your heart beat faster and makes you sweat.  We recommend exercising at least 30 minutes per day at least 3 days a week, preferably 4 or 5.  We also recommend a diet low in fat and sugar.  Inactivity, poor dietary choices and obesity can cause diabetes, heart attack, stroke, and kidney damage, among others.    ALCOHOL AND SMOKING:  Women should limit their alcohol intake to no more than 7 drinks/beers/glasses of wine (combined, not each!) per week. Moderation of alcohol intake to this level decreases your risk of breast cancer and liver damage. And of course, no recreational drugs are part of a healthy lifestyle.  And absolutely no smoking or even second hand smoke. Most people know smoking can cause heart and lung diseases, but did you know it also contributes to weakening of your bones? Aging of your skin?  Yellowing of your teeth and nails?  CALCIUM AND VITAMIN D:  Adequate intake of calcium and Vitamin D are recommended.  The recommendations for exact amounts of these supplements seem to change often, but generally speaking 600 mg of calcium (either carbonate or citrate) and 800 units of Vitamin D per day seems prudent. Certain women may benefit from higher intake of Vitamin D.  If you are among these women, your doctor will have told you during your visit.    PAP SMEARS:  Pap smears, to check for cervical cancer or precancers,  have traditionally been done yearly, although recent scientific advances have shown that most women can have pap smears less often.  However, every woman still should have a physical exam from her gynecologist every year. It will include a breast check, inspection of the vulva and vagina to check for abnormal growths or skin changes, a visual exam of the cervix, and then an exam to evaluate the size and shape of the uterus and  ovaries.  And after 44 years of age, a rectal exam is indicated to check for rectal cancers. We will also provide age appropriate advice regarding health maintenance, like when you should have certain vaccines, screening for sexually transmitted diseases, bone density testing, colonoscopy, mammograms, etc.   MAMMOGRAMS:  All women over 40 years old should have a yearly mammogram. Many facilities now offer a "3D" mammogram, which may cost around $50 extra out of pocket. If possible,  we recommend you accept the option to have the 3D mammogram performed.  It both reduces the number of women who will be called back for extra views which then turn out to be normal, and it is better than the routine mammogram at detecting truly abnormal areas.    COLONOSCOPY:  Colonoscopy to screen for colon cancer is recommended for all women at age 50.  We know, you hate the idea of the prep.  We agree, BUT, having colon cancer and not knowing it is worse!!  Colon cancer so often starts as a polyp that can be seen and removed at colonscopy, which can quite literally save your life!  And if your first colonoscopy is normal and you have no family history of colon cancer, most women don't have to have it again for 10 years.  Once every ten years, you can do something that may end up saving your life, right?  We will be happy to help you get it scheduled when you are ready.    Be sure to check your insurance coverage so you understand how much it will cost.  It may be covered as a preventative service at no cost, but you should check your particular policy.      Breast Self-Awareness Breast self-awareness means being familiar with how your breasts look and feel. It involves checking your breasts regularly and reporting any changes to your health care provider. Practicing breast self-awareness is important. A change in your breasts can be a sign of a serious medical problem. Being familiar with how your breasts look and feel allows  you to find any problems early, when treatment is more likely to be successful. All women should practice breast self-awareness, including women who have had breast implants. How to do a breast self-exam One way to learn what is normal for your breasts and whether your breasts are changing is to do a breast self-exam. To do a breast self-exam: Look for Changes  1. Remove all the clothing above your waist. 2. Stand in front of a mirror in a room with good lighting. 3. Put your hands on your hips. 4. Push your hands firmly downward. 5. Compare your breasts in the mirror. Look for differences between them (asymmetry), such as: ? Differences in shape. ? Differences in size. ? Puckers, dips, and bumps in one breast and not the other. 6. Look at each breast for changes in your skin, such as: ? Redness. ? Scaly areas. 7. Look for changes in your nipples, such as: ? Discharge. ? Bleeding. ? Dimpling. ? Redness. ? A change in position. Feel for Changes  Carefully feel your breasts for lumps and changes. It is best to do this while lying on your back on the floor and again while sitting or standing in the shower or tub with soapy water on your skin. Feel each breast in the following way:  Place the arm on the side of the breast you are examining above your head.  Feel your breast with the other hand.  Start in the nipple area and make  inch (2 cm) overlapping circles to feel your breast. Use the pads of your three middle fingers to do this. Apply light pressure, then medium pressure, then firm pressure. The light pressure will allow you to feel the tissue closest to the skin. The medium pressure will allow you to feel the tissue that is a little deeper. The firm pressure will allow you to feel the tissue close to the ribs.  Continue the overlapping circles, moving downward over the breast until you feel your ribs below your breast.  Move one finger-width toward the center of the body.  Continue to use the  inch (2 cm) overlapping circles to feel your breast as you move slowly up toward your collarbone.  Continue the up and down exam using all three pressures until you reach your armpit.  Write Down What You Find  Write down what is normal for each breast and any changes that you find. Keep a written record with breast changes or normal findings for each breast. By writing this information down, you do not need to depend only on memory for size, tenderness, or location. Write down where you are in your menstrual cycle, if you are still menstruating. If you are having trouble noticing differences in your breasts, do not get discouraged. With time you will become more familiar with the variations in your breasts and more comfortable with the exam. How often should I examine my breasts? Examine   your breasts every month. If you are breastfeeding, the best time to examine your breasts is after a feeding or after using a breast pump. If you menstruate, the best time to examine your breasts is 5-7 days after your period is over. During your period, your breasts are lumpier, and it may be more difficult to notice changes. When should I see my health care provider? See your health care provider if you notice:  A change in shape or size of your breasts or nipples.  A change in the skin of your breast or nipples, such as a reddened or scaly area.  Unusual discharge from your nipples.  A lump or thick area that was not there before.  Pain in your breasts.  Anything that concerns you.  This information is not intended to replace advice given to you by your health care provider. Make sure you discuss any questions you have with your health care provider. Document Released: 09/22/2005 Document Revised: 02/28/2016 Document Reviewed: 08/12/2015 Elsevier Interactive Patient Education  2018 Elsevier Inc.  

## 2017-08-19 NOTE — Progress Notes (Signed)
46 y.o. [id number] MarriedCaucasianF here for annual exam.   Will have to move to [place] next summer for her husbands work.  Period Cycle (Days): 28 Period Duration (Days): 4 days  Period Pattern: Regular Menstrual Flow: Heavy, Moderate Menstrual Control: Thin pad, Tampon Menstrual Control Change Freq (Hours): changes tampon every 3 hours  Dysmenorrhea: None  On her heaviest day she can change a super tampon every 2 hours. No BTB. Sexually active, no pain. She is having trouble with her hearing, she will likely need hearing aids. Has trouble with background noise. Still having some weakness in her LE joints vs muscles. Is f/u with her primary.   Patient's last menstrual period was 07/30/2017.          Sexually active: Yes.    The current method of family planning is vasectomy.    Exercising: Yes.    running/ spin Smoker:  no  Health Maintenance: Pap:  07-14-16 WNL NEG HR HPV History of abnormal Pap:  no MMG:  07-16-17 abnormal- Right breast U/S WNL  Colonoscopy: Never BMD:  Never TDaP:  06-26-09 Gardasil: No    reports that  has never smoked. she has never used smokeless tobacco. She reports that she drinks about 0.6 oz of alcohol per week. She reports that she does not use drugs.2 daughters, oldest is married in [place]. Corliss [place]oungest is in college in [place].   Past Medical History:  Diagnosis Date  . Allergy   . Heart murmur   . History of chicken pox   . Vitamin D deficiency     Past Surgical History:  Procedure Laterality Date  . CESAREAN SECTION      Current Outpatient Medications  Medication Sig Dispense Refill  . Ascorbic Acid (VITAMIN C ADULT GUMMIES PO) Take by mouth.    . levothyroxine (SYNTHROID, LEVOTHROID) 50 MCG tablet TAKE 1 TABLET BY MOUTH EVERY DAY 30 tablet 5  . Misc Natural Products (GLUCOSAMINE CHONDROITIN VIT D3) CAPS Take by mouth.    . Multiple Vitamins-Minerals (MULTIVITAMIN ADULT) CHEW Chew by mouth.     No current facility-administered medications  for this visit.     Family History  Problem Relation Age of Onset  . Osteoporosis Maternal Grandmother   . Heart disease Maternal Grandfather   . Osteoporosis Paternal Grandmother   . Alcohol abuse Father   . Liver disease Father     Review of Systems  Constitutional: Negative.   HENT: Negative.   Eyes: Negative.   Respiratory: Negative.   Cardiovascular: Negative.   Gastrointestinal: Negative.   Endocrine: Negative.   Genitourinary: Negative.   Musculoskeletal: Positive for joint swelling and myalgias.  Skin: Negative.   Allergic/Immunologic: Negative.   Neurological: Negative.   Psychiatric/Behavioral: Negative.     Exam:   BP 100/60 (BP Location: Right Arm, Patient Position: Sitting, Cuff Size: Normal)   Pulse 72   Resp 14   Ht 5\' 10"  (1.778 m)   Wt 160 lb (72.6 kg)   LMP 07/30/2017   BMI 22.96 kg/m   Weight change: @WEIGHTCHANGE @ Height:   Height: 5\' 10"  (177.8 cm)  Ht Readings from Last 3 Encounters:  08/19/17 5\' 10"  (1.778 m)  03/23/17 5\' 11"  (1.803 m)  02/23/17 5\' 11"  (1.803 m)    General appearance: alert, cooperative and appears stated age Head: Normocephalic, without obvious abnormality, atraumatic Neck: no adenopathy, supple, symmetrical, trachea midline and thyroid normal to inspection and palpation Lungs: clear to auscultation bilaterally Cardiovascular: regular rate and rhythm Breasts: smooth mobile lump  in the upper outer quadrant of the right breast, correlates with recent diagnosis of a cyst. No other lumps, no skin changes.  Abdomen: soft, non-tender; non distended,  no masses,  no organomegaly Extremities: extremities normal, atraumatic, no cyanosis or edema Skin: Skin color, texture, turgor normal. No rashes or lesions Lymph nodes: Cervical, supraclavicular, and axillary nodes normal. No abnormal inguinal nodes palpated Neurologic: Grossly normal   Pelvic: External genitalia:  no lesions              Urethra:  normal appearing urethra with  no masses, tenderness or lesions              Bartholins and Skenes: normal                 Vagina: normal appearing vagina with normal color and discharge, no lesions              Cervix: no lesions               Bimanual Exam:  Uterus:  normal size, contour, position, consistency, mobility, non-tender              Adnexa: no mass, fullness, tenderness               Rectovaginal: Confirms               Anus:  normal sphincter tone, no lesions  Chaperone was present for exam.  A:  Well Woman with normal exam  P:   No pap this year  Mammogram UTD  Labs are UTD with her primary  Discussed breast self exam  Discussed calcium and vit D intake

## 2017-08-24 ENCOUNTER — Other Ambulatory Visit: Payer: Self-pay | Admitting: Family Medicine

## 2017-08-25 ENCOUNTER — Ambulatory Visit: Payer: BLUE CROSS/BLUE SHIELD | Admitting: Obstetrics and Gynecology

## 2017-10-28 ENCOUNTER — Other Ambulatory Visit: Payer: Self-pay

## 2017-10-28 ENCOUNTER — Encounter: Payer: Self-pay | Admitting: Family Medicine

## 2017-10-28 ENCOUNTER — Ambulatory Visit (INDEPENDENT_AMBULATORY_CARE_PROVIDER_SITE_OTHER): Payer: BLUE CROSS/BLUE SHIELD | Admitting: Family Medicine

## 2017-10-28 VITALS — BP 110/81 | HR 68 | Temp 97.9°F | Resp 17 | Ht 70.0 in | Wt 163.1 lb

## 2017-10-28 DIAGNOSIS — Q383 Other congenital malformations of tongue: Secondary | ICD-10-CM

## 2017-10-28 DIAGNOSIS — R432 Parageusia: Secondary | ICD-10-CM

## 2017-10-28 LAB — CBC WITH DIFFERENTIAL/PLATELET
BASOS ABS: 0 10*3/uL (ref 0.0–0.1)
Basophils Relative: 0.5 % (ref 0.0–3.0)
Eosinophils Absolute: 0.1 10*3/uL (ref 0.0–0.7)
Eosinophils Relative: 2.8 % (ref 0.0–5.0)
HEMATOCRIT: 39.8 % (ref 36.0–46.0)
Hemoglobin: 13.2 g/dL (ref 12.0–15.0)
LYMPHS PCT: 38.7 % (ref 12.0–46.0)
Lymphs Abs: 1.9 10*3/uL (ref 0.7–4.0)
MCHC: 33.3 g/dL (ref 30.0–36.0)
MCV: 96.9 fl (ref 78.0–100.0)
MONOS PCT: 7.4 % (ref 3.0–12.0)
Monocytes Absolute: 0.4 10*3/uL (ref 0.1–1.0)
NEUTROS PCT: 50.6 % (ref 43.0–77.0)
Neutro Abs: 2.5 10*3/uL (ref 1.4–7.7)
Platelets: 253 10*3/uL (ref 150.0–400.0)
RBC: 4.1 Mil/uL (ref 3.87–5.11)
RDW: 14 % (ref 11.5–15.5)
WBC: 5 10*3/uL (ref 4.0–10.5)

## 2017-10-28 LAB — B12 AND FOLATE PANEL
Folate: 7.6 ng/mL (ref >5.9)
Vitamin B-12: 241 pg/mL (ref 211–911)

## 2017-10-28 LAB — TSH: TSH: 2.22 u[IU]/mL (ref 0.35–4.50)

## 2017-10-28 NOTE — Patient Instructions (Signed)
Follow up as needed We'll notify you of your lab results and make any changes if needed If we don't find any answers I would recommend follow up with your dentist or we can send you to ENT for additional evaluation Call with any questions or concerns Hang in there!

## 2017-10-28 NOTE — Progress Notes (Signed)
   Subjective:    Patient ID: Michele Russo, female    DOB: [birth year], 47 y.o.   MRN: [id number]  HPI Tongue issues- 'it almost feels like I burned my tongue but I haven't'.  Pt reports 'dark red spots on the back of my tongue'.  Pt denies any new or different foods.  sxs started 'a few days ago'.  Pt reports ridges on sides of tongue.  Pt has been Google'ing her sxs and is concerned about possible thyroid issues.  No difficulty swallowing.  Decreased taste   Review of Systems For ROS see HPI     Objective:   Physical Exam  Constitutional: She is oriented to person, place, and time. She appears well-developed and well-nourished. No distress.  HENT:  Head: Normocephalic and atraumatic.  Mouth/Throat: Oropharynx is clear and moist. No oropharyngeal exudate.  Tongue appears to be normal w/ dark red papillae on posterior tongue.  No ulcerations, lesions, masses.  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   Lymphadenopathy:    She has no cervical adenopathy.  Neurological: She is alert and oriented to person, place, and time.  Skin: Skin is warm and dry.  Psychiatric: Her behavior is normal. Thought content normal.  anxious  Vitals reviewed.         Assessment & Plan:  Tongue abnormality- new.  reassured pt that her tongue appears normal and that it is common to have frictional keratosis in the setting of orthodontic work.  However, her burning sensation and altered taste are abnormal.  Check labs to assess thyroid, B12 and folate.  Discussed burning tongue syndrome.  If no improvement and labs are unrevealing will need to see dentist or ENT.  Pt expressed understanding and is in agreement w/ plan.

## 2017-12-23 ENCOUNTER — Ambulatory Visit (INDEPENDENT_AMBULATORY_CARE_PROVIDER_SITE_OTHER): Payer: BLUE CROSS/BLUE SHIELD | Admitting: Family Medicine

## 2017-12-23 ENCOUNTER — Encounter: Payer: Self-pay | Admitting: Family Medicine

## 2017-12-23 ENCOUNTER — Other Ambulatory Visit: Payer: Self-pay

## 2017-12-23 VITALS — BP 106/80 | HR 66 | Temp 98.1°F | Resp 16 | Ht 70.0 in | Wt 165.2 lb

## 2017-12-23 DIAGNOSIS — Z0000 Encounter for general adult medical examination without abnormal findings: Secondary | ICD-10-CM | POA: Diagnosis not present

## 2017-12-23 DIAGNOSIS — R208 Other disturbances of skin sensation: Secondary | ICD-10-CM | POA: Diagnosis not present

## 2017-12-23 DIAGNOSIS — R200 Anesthesia of skin: Secondary | ICD-10-CM

## 2017-12-23 DIAGNOSIS — E559 Vitamin D deficiency, unspecified: Secondary | ICD-10-CM | POA: Diagnosis not present

## 2017-12-23 LAB — CBC WITH DIFFERENTIAL/PLATELET
BASOS ABS: 0 10*3/uL (ref 0.0–0.1)
Basophils Relative: 0.3 % (ref 0.0–3.0)
Eosinophils Absolute: 0.2 10*3/uL (ref 0.0–0.7)
Eosinophils Relative: 3.1 % (ref 0.0–5.0)
HCT: 39.4 % (ref 36.0–46.0)
Hemoglobin: 13.4 g/dL (ref 12.0–15.0)
Lymphocytes Relative: 42.7 % (ref 12.0–46.0)
Lymphs Abs: 2.3 10*3/uL (ref 0.7–4.0)
MCHC: 34 g/dL (ref 30.0–36.0)
MCV: 95.1 fl (ref 78.0–100.0)
MONOS PCT: 6.4 % (ref 3.0–12.0)
Monocytes Absolute: 0.3 10*3/uL (ref 0.1–1.0)
NEUTROS PCT: 47.5 % (ref 43.0–77.0)
Neutro Abs: 2.5 10*3/uL (ref 1.4–7.7)
PLATELETS: 216 10*3/uL (ref 150.0–400.0)
RBC: 4.14 Mil/uL (ref 3.87–5.11)
RDW: 13.8 % (ref 11.5–15.5)
WBC: 5.3 10*3/uL (ref 4.0–10.5)

## 2017-12-23 LAB — VITAMIN D 25 HYDROXY (VIT D DEFICIENCY, FRACTURES): VITD: 25.8 ng/mL — ABNORMAL LOW (ref 30.00–100.00)

## 2017-12-23 LAB — TSH: TSH: 2.58 u[IU]/mL (ref 0.35–4.50)

## 2017-12-23 NOTE — Progress Notes (Signed)
   Subjective:    Patient ID: Michele Russo, female    DOB: [birth year], 47 y.o.   MRN: [id number]  HPI CPE- UTD on pap, mammo, Tdap, flu.  Pt is moving to [place] in June.   Review of Systems Patient reports no vision/ hearing changes, adenopathy,fever, weight change,  persistant/recurrent hoarseness , swallowing issues, chest pain, palpitations, edema, persistant/recurrent cough, hemoptysis, dyspnea (rest/exertional/paroxysmal nocturnal), gastrointestinal bleeding (melena, rectal bleeding), abdominal pain, significant heartburn, bowel changes, GU symptoms (dysuria, hematuria, incontinence), Gyn symptoms (abnormal  bleeding, pain),  syncope, focal weakness, memory loss, skin/hair/nail changes, abnormal bruising or bleeding, anxiety, or depression.   'weird nerve issue'- pt reports she has a small spot on her L anterior knee 'that when I hit it, it lights up'.  Pt also has pain in L foot when she plantar flexes and she will have a shooting pain across the top of her foot into big toe.  sxs started 5 weeks ago during her move.    Objective:   Physical Exam General Appearance:    Alert, cooperative, no distress, appears stated age  Head:    Normocephalic, without obvious abnormality, atraumatic  Eyes:    PERRL, conjunctiva/corneas clear, EOM's intact, fundi    benign, both eyes  Ears:    Normal TM's and external ear canals, both ears  Nose:   Nares normal, septum midline, mucosa normal, no drainage    or sinus tenderness  Throat:   Lips, mucosa, and tongue normal; teeth and gums normal  Neck:   Supple, symmetrical, trachea midline, no adenopathy;    Thyroid: no enlargement/tenderness/nodules  Back:     Symmetric, no curvature, ROM normal, no CVA tenderness  Lungs:     Clear to auscultation bilaterally, respirations unlabored  Chest Wall:    No tenderness or deformity   Heart:    Regular rate and rhythm, S1 and S2 normal, no murmur, rub   or gallop  Breast Exam:    Deferred to GYN  Abdomen:      Soft, non-tender, bowel sounds active all four quadrants,    no masses, no organomegaly  Genitalia:    Deferred to GYN  Rectal:    Extremities:   Extremities normal, atraumatic, no cyanosis or edema  Pulses:   2+ and symmetric all extremities  Skin:   Skin color, texture, turgor normal, no rashes or lesions  Lymph nodes:   Cervical, supraclavicular, and axillary nodes normal  Neurologic:   CNII-XII intact, normal strength, sensation and reflexes    throughout          Assessment & Plan:  L foot numbness- new.  Occurs when pt plantar flexes her foot.  I question whether she has tarsal tunnel.  Refer to Sports Med for evaluation.  Pt expressed understanding and is in agreement w/ plan.

## 2017-12-23 NOTE — Assessment & Plan Note (Signed)
Pt's PE WNL.  UTD on GYN and immunizations.  Check labs.  Anticipatory guidance provided.  

## 2017-12-23 NOTE — Patient Instructions (Signed)
Follow up in 1 year or as needed We'll notify you of your lab results and make any changes if needed Keep up the good work on healthy diet and regular exercise- you look great! We'll call you with your Sports Med appt Call with any questions or concerns GOOD LUCK ON THE MOVE!!!

## 2017-12-23 NOTE — Assessment & Plan Note (Signed)
Pt has hx of this.  Check labs and replete prn. 

## 2017-12-24 ENCOUNTER — Ambulatory Visit: Payer: BLUE CROSS/BLUE SHIELD | Admitting: Sports Medicine

## 2017-12-24 LAB — LIPID PANEL
CHOL/HDL RATIO: 2
CHOLESTEROL: 144 mg/dL (ref 0–200)
HDL: 62 mg/dL (ref >39.00)
LDL Cholesterol: 64 mg/dL (ref 0–99)
NonHDL: 81.7
Triglycerides: 88 mg/dL (ref 0.0–149.0)
VLDL: 17.6 mg/dL (ref 0.0–40.0)

## 2017-12-24 LAB — BASIC METABOLIC PANEL
BUN: 13 mg/dL (ref 6–23)
CO2: 24 meq/L (ref 19–32)
Calcium: 9.6 mg/dL (ref 8.4–10.5)
Chloride: 103 mEq/L (ref 96–112)
Creatinine, Ser: 0.96 mg/dL (ref 0.40–1.20)
GFR: 67 mL/min (ref >60.00)
GLUCOSE: 87 mg/dL (ref 70–99)
Potassium: 4.4 mEq/L (ref 3.5–5.1)
SODIUM: 137 meq/L (ref 135–145)

## 2017-12-24 LAB — HEPATIC FUNCTION PANEL
ALT: 8 U/L (ref 0–35)
AST: 13 U/L (ref 0–37)
Albumin: 4.6 g/dL (ref 3.5–5.2)
Alkaline Phosphatase: 46 U/L (ref 39–117)
BILIRUBIN DIRECT: 0.1 mg/dL (ref 0.0–0.3)
TOTAL PROTEIN: 6.9 g/dL (ref 6.0–8.3)
Total Bilirubin: 0.2 mg/dL (ref 0.2–1.2)

## 2017-12-25 ENCOUNTER — Encounter: Payer: Self-pay | Admitting: Family Medicine

## 2017-12-25 ENCOUNTER — Other Ambulatory Visit: Payer: Self-pay | Admitting: Family Medicine

## 2017-12-25 MED ORDER — VITAMIN D (ERGOCALCIFEROL) 1.25 MG (50000 UNIT) PO CAPS: 50000.0000 [IU] | ORAL_CAPSULE | ORAL | 0 refills | Status: AC

## 2017-12-28 ENCOUNTER — Ambulatory Visit: Payer: BLUE CROSS/BLUE SHIELD | Admitting: Sports Medicine

## 2017-12-29 ENCOUNTER — Ambulatory Visit (INDEPENDENT_AMBULATORY_CARE_PROVIDER_SITE_OTHER): Payer: BLUE CROSS/BLUE SHIELD | Admitting: Sports Medicine

## 2017-12-29 ENCOUNTER — Encounter: Payer: Self-pay | Admitting: Sports Medicine

## 2017-12-29 VITALS — BP 69/64 | HR 64 | Ht 70.0 in | Wt 164.0 lb

## 2017-12-29 DIAGNOSIS — M7632 Iliotibial band syndrome, left leg: Secondary | ICD-10-CM | POA: Diagnosis not present

## 2017-12-29 DIAGNOSIS — M25562 Pain in left knee: Secondary | ICD-10-CM | POA: Diagnosis not present

## 2017-12-29 DIAGNOSIS — G5792 Unspecified mononeuropathy of left lower limb: Secondary | ICD-10-CM | POA: Diagnosis not present

## 2017-12-29 DIAGNOSIS — R29898 Other symptoms and signs involving the musculoskeletal system: Secondary | ICD-10-CM

## 2017-12-29 MED ORDER — DICLOFENAC SODIUM 2 % TD SOLN: 1.0000 "application " | Freq: Two times a day (BID) | TRANSDERMAL | 0 refills | Status: AC

## 2017-12-29 MED ORDER — DICLOFENAC SODIUM 2 % TD SOLN: 1.0000 "application " | Freq: Two times a day (BID) | TRANSDERMAL | 2 refills | Status: AC

## 2017-12-29 NOTE — Patient Instructions (Addendum)
Please perform the exercise program that we have prepared for you and gone over in detail on a daily basis.  In addition to the handout you were provided you can access your program through: [email or web address]   Your unique program code is: [email or web address]  Also check out [employer]"Foundation Training" which is a program developed by Dr. Myles LippsEric Goodman.   There are links to a couple of his YouTube Videos below and I would like to see performing one of his videos 5-6 days per week.    A good intro video is: "Independence from Pain 7-minute Video" - [email or web address]   His more advanced video is: "Powerful Posture and Pain Relief: 12 minutes of Foundation Training" - [email or web address].[email or web address]  Do not try to attempt this entire video when first beginning.    Try breaking of each exercise that he goes into shorter segments.  Otherwise if they perform an exercise for 45 seconds, start with 15 seconds and rest and then resume when they begin the new activity.    If you work your way up to doing this 12 minute video, I expect you will see significant improvements in your pain.  If you enjoy his videos and would like to find out more you can look on his website: [email or web address].  He has a workout streaming option as well as a DVD set available for purchase.  Amazon has the best price for his DVDs.     [facility] instructions for Duexis, Pennsaid and Vimovo:  Your prescription will be filled through a mail order pharmacy.  It is typically [facility] but may vary depending on where you live.  You will receive a phone call from them which will typically come from a [phone] phone number.  You must speak directly to them to have this medication filled.  When the pharmacy calls, they will need your mailing address (for overnight shipment of the medication) andy they will need payment information if you have a copay (typically no more than $10). If you have not heard from them 2-3 days  after your appointment with Dr. Berline Choughigby, contact us at the office [phone]) or through MyChart so we can reach back out to the pharmacy.

## 2017-12-29 NOTE — Progress Notes (Signed)
Veverly FellsMichael D. Delorise Shinerigby, DO  [facility] [phone]  Michele Keeneresa Leet - 47 y.o. female MRN [id number]  Date of birth: [birth year]  Visit Date: 12/29/2017  PCP: Sheliah Hatchabori, Katherine E, MD   Referred by: Sheliah Hatchabori, Katherine E, MD  Scribe for today's visit: Stevenson ClinchBrandy Coleman, CMA     SUBJECTIVE:  Michele Russo is here for New Patient (Initial Visit) (numbness of L foot) .  Referred by: Neena RhymesKatherine Tabori, MD Her L foot numbness symptoms INITIALLY: Began mid February 2019 and started after a recent move.  Described as moderate-severe quick  sharp, radiating to K great toe.  Worsened with dorsiflexion. Pain radiates into the great toe.  Improved with rest, no pain with weight bearing Additional associated symptoms include: She has noticed a spot on her patella that responds the same way; she can feel pain even when shaving over the knee. If she kneels down it "lights up". She has hx of lower back pain with R-sided sciatica which resolved on its own. She woke from sleep d/t cramping in the L foot about 3 nights ago.     At this time symptoms show no change compared to onset  She has not taken anything for the pain because its only with certain movements.   No recent XR of lower back.   ROS Reports night time disturbances. Denies fevers, chills, or night sweats. Denies unexplained weight loss. Denies personal history of cancer. Denies changes in bowel or bladder habits. Denies recent unreported falls. Denies new or worsening dyspnea or wheezing. Denies headaches or dizziness.  Reports numbness, tingling or weakness  In the extremities.  Denies dizziness or presyncopal episodes Denies lower extremity edema    HISTORY & PERTINENT PRIOR DATA:  Prior History reviewed and updated per electronic medical record.  Significant/pertinent history, findings, studies include:  reports that she has never smoked. She has never used smokeless tobacco. No  results for input(s): HGBA1C, LABURIC, CREATINE in the last 8760 hours. DM supply source: N/A  Local pharmacy: [facility] [place]  Allergies: NSAID's-hives, anti-histamines (claritin, allegra)-hives, morphine-hives  Immunization Status  Flu vaccine-- last year Tdap-- about 6-7 yrs ago after dog bite PNA-- Never Shingles-- Never  Changes to FH, PSH or Personal Hx: Updated Pap-- yearly-past due-[facility]--yearly-past due Bone Density-- Never CCS-- Never  Care Teams Updated: N/A Recent ED/Hospital/Urgent Care Visits: No recent visits  Pt Advised to Bring: Updated insurance, contact information, forms: Advised  Pt Reminded to Bring: DPR information, advance directives: Advised  To Discuss with Provider: Establish care, wants referral for mammogram and pap smear No problems updated.  OBJECTIVE:  VS:  HT:5\' 10"  (177.8 cm)   WT:164 lb (74.4 kg)  BMI:23.53    BP:(Abnormal) 69/64  HR:64bpm  TEMP: ( )  RESP:96 %   PHYSICAL EXAM: Constitutional: WDWN, Non-toxic appearing. Psychiatric: Alert & appropriately interactive.  Not depressed or anxious appearing. Respiratory: No increased work of breathing.  Trachea Midline Eyes: Pupils are equal.  EOM intact without nystagmus.  No scleral icterus  VASCULAR: DP & PT Pulses: normal, 2+/4 lower extremity neuro exam: unremarkable normal sensation  Left leg: Overall well aligned.  She has normal musculature.  She does have pain over the distal aspect of the IT band syndrome as well as over the Gertie's tubercle.  She has no pain with McMurray's testing or Thessaly but does have pain with full flexion.  She has full flexion and extension.  No effusion at this time.  Small amount of crepitation with patellar grind testing bilaterally.  Otherwise she is ligamentously stable.  Hip abduction strength is 4 out of 5 on the left with isolation of the glute medius but otherwise has good  generalized strength both lower extremities.  Positive Noble's test on the left, normal on the right  Left foot is normal appearing she has a moderate cavus arch with good posterior tibialis recruitment.  No pain directly over the tarsal tunnel but she does have a positive Tinel's over the anterior lateral ankle directly over the extensor retinaculum consistent with laces neuralgia.  This does cause radiation into the great toe.  She has good normal ankle dorsiflexion and plantarflexion range of motion as well as normal toe motion.  No significant transverse arch breakdown.  Gait is overall quite neutral at running gait does have a slight hip drop on the left compared to the right consistent with glute medius insufficiency.  No significant genu valgus.   ASSESSMENT & PLAN:   1. Iliotibial band syndrome of left side   2. Neuritis of left foot   3. Arthralgia of left knee   4. Weakness of left hip     PLAN: She does have some weakness with hip abduction and has a markedly predominant T FL on the left which is contributing to her IT band syndrome.  There is low likelihood of this being associated with meniscus given the lack of symptoms with McMurray's and the positive Noble test much less consistent.  We will plan to start with topical anti-inflammatories and to discuss her allergy to NSAIDs given the low systemic absorption and lack of significant anaphylactic-like reactions worthwhile trying.  For foot pain she does report that she was tightening her laces too tightly and has subsequently already started working on this however given think that she is significantly irritated the nerve this will likely take some time and would benefit from the anti-inflammatories as prescribed above.  If any lack of improvement can consider ultrasound-guided hydrodissection and injection.  Follow-up: Return in about 6 weeks (around 02/09/2018).     Please see additional documentation for Objective, Assessment and  Plan sections. Pertinent additional documentation may be included in corresponding procedure notes, imaging studies, problem based documentation and patient instructions. Please see these sections of the encounter for additional information regarding this visit.  CMA/ATC served as Neurosurgeon during this visit. History, Physical, and Plan performed by medical provider. Documentation and orders reviewed and attested to.      Andrena Mews, DO    [facility]

## 2017-12-29 NOTE — Progress Notes (Signed)
PROCEDURE NOTE: THERAPEUTIC EXERCISES (97110) 15 minutes spent for Therapeutic exercises as below and as referenced in the AVS. This included exercises focusing on stretching, strengthening, with significant focus on eccentric aspects.  Proper technique shown and discussed handout in great detail with ATC. All questions were discussed and answered.   Long term goals include an improvement in range of motion, strength, endurance as well as avoiding reinjury. Frequency of visits is one time as determined during today's  office visit. Frequency of exercises to be performed is as per handout.  EXERCISES REVIEWED: hip abduction with glute med focus itb stretching  goodman exercises

## 2018-02-04 ENCOUNTER — Ambulatory Visit: Payer: BLUE CROSS/BLUE SHIELD | Admitting: Sports Medicine

## 2018-03-04 IMAGING — DX DG CERVICAL SPINE COMPLETE 4+V
5 series · 5 of 5 positions shown · non-contrast
Comparison: None.

CLINICAL DATA: Neck pain for 2 years.

EXAM:
CERVICAL SPINE - COMPLETE 4+ VIEW

[c-spine lat]
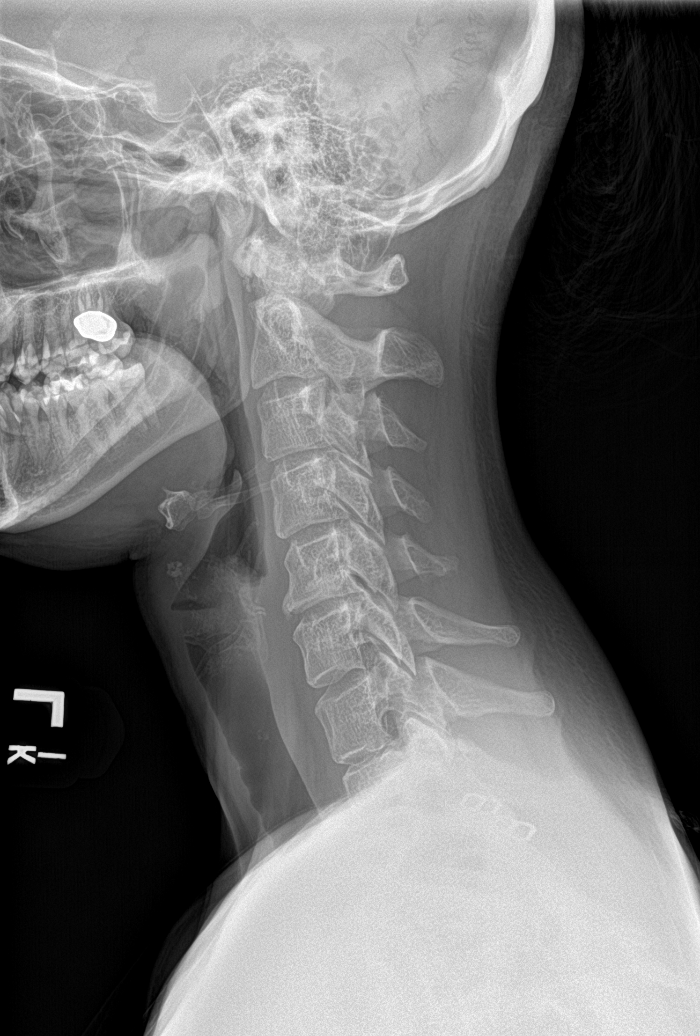

[c-spine obl (1 of 2)]
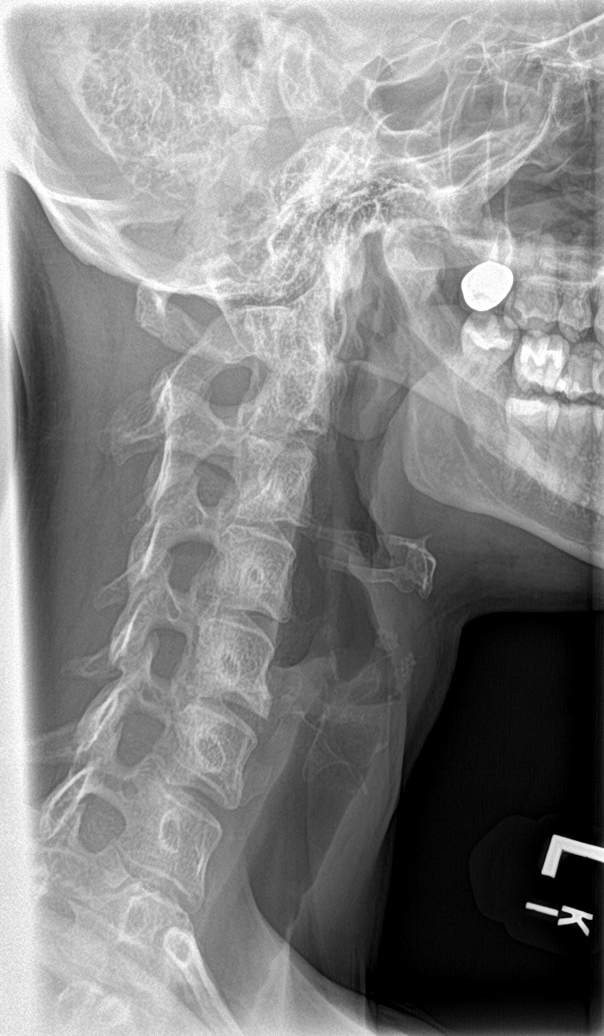

[c-spine obl (2 of 2)]
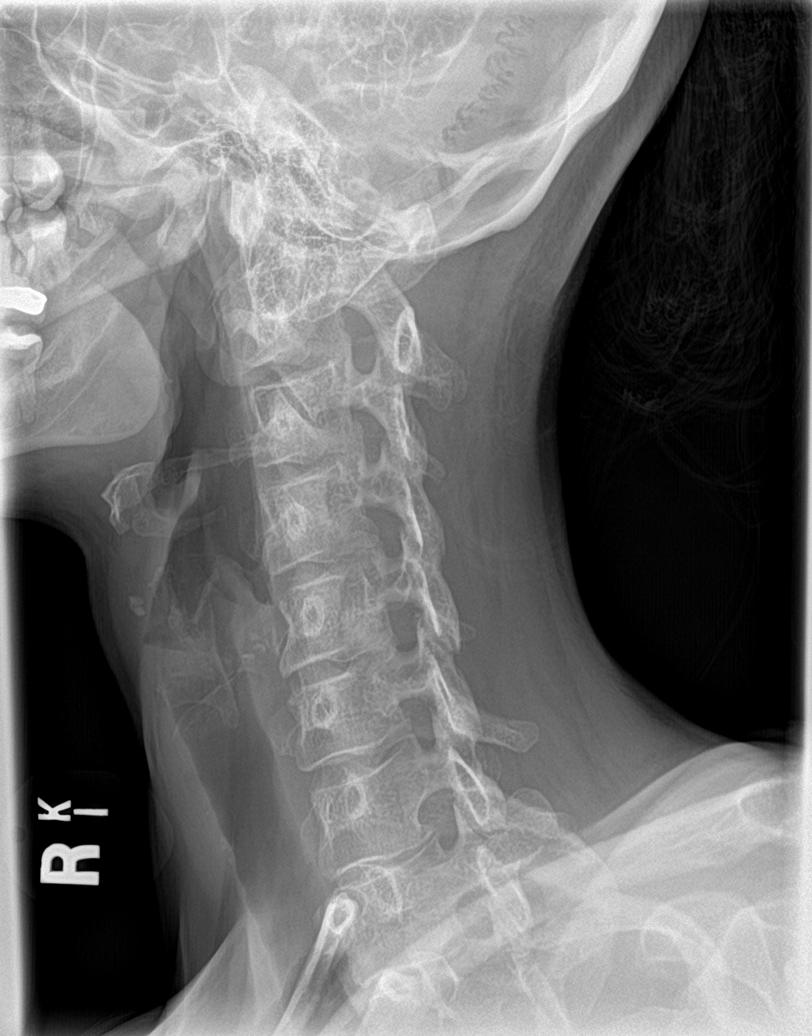

[c-spine ap]
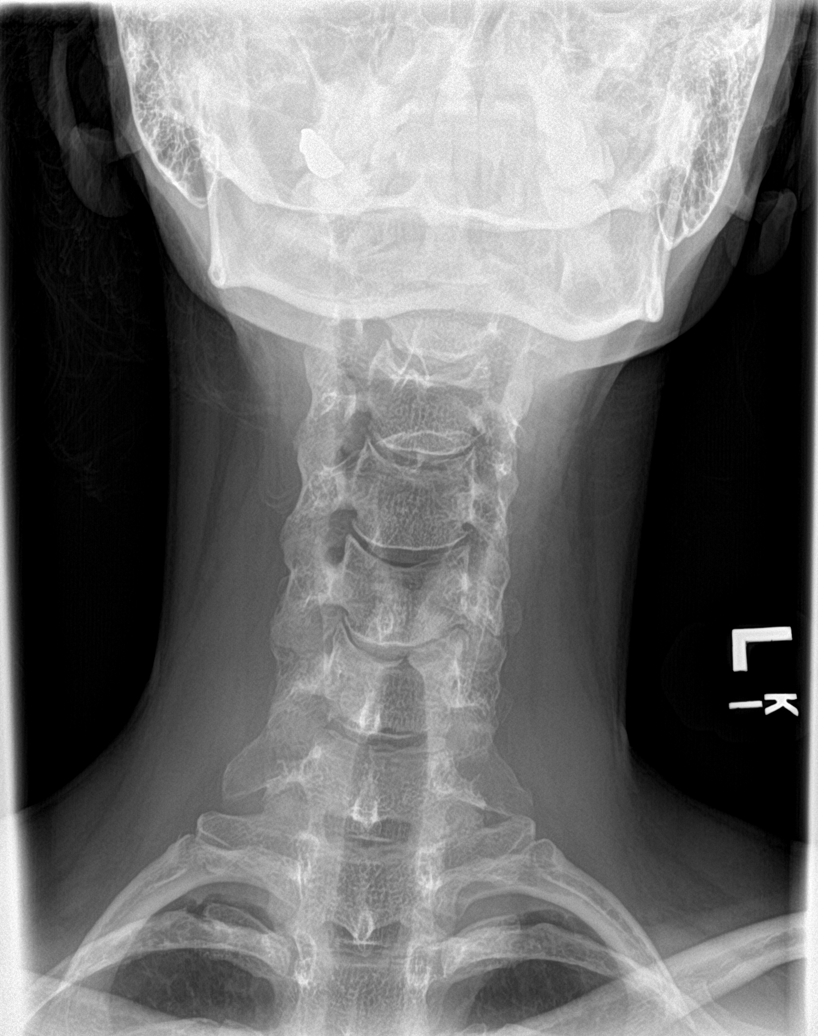

[c-spine open mouth]
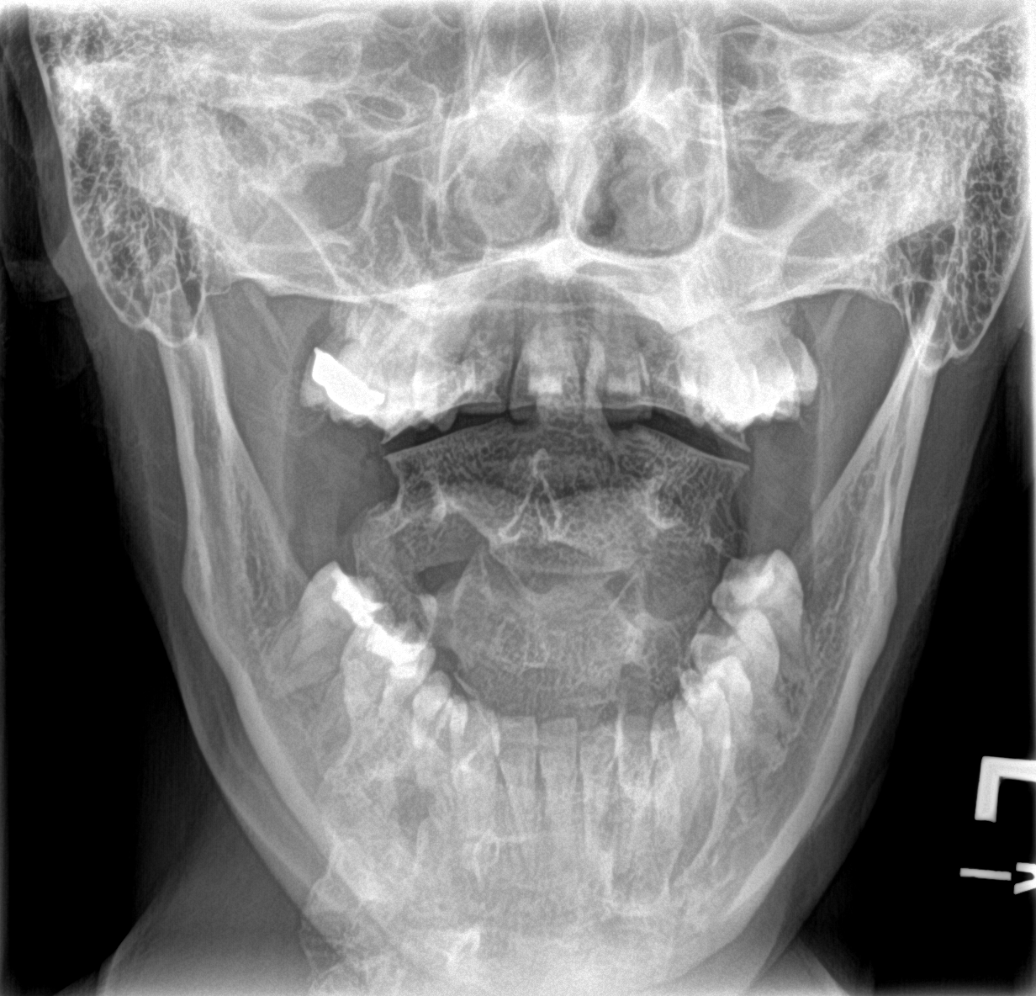

[5 of 5 positions shown; findings below may reference images not displayed]

FINDINGS: There is no evidence of cervical spine fracture or prevertebral soft
tissue swelling. Alignment is normal. No other significant bone
abnormalities are identified.
IMPRESSION: Negative cervical spine radiographs.

## 2018-04-11 ENCOUNTER — Other Ambulatory Visit: Payer: Self-pay | Admitting: Family Medicine

## 2018-04-21 ENCOUNTER — Encounter: Payer: Self-pay | Admitting: Family Medicine

## 2018-04-21 MED ORDER — LEVOTHYROXINE SODIUM 50 MCG PO TABS: 50.0000 ug | ORAL_TABLET | Freq: Every day | ORAL | 0 refills | Status: AC

## 2018-08-05 ENCOUNTER — Encounter: Payer: Self-pay | Admitting: Family Medicine

## 2019-01-19 ENCOUNTER — Other Ambulatory Visit: Payer: Self-pay | Admitting: Family Medicine

## 2019-01-20 NOTE — Telephone Encounter (Signed)
Has not been seen since Jan 2019

## 2019-01-21 ENCOUNTER — Encounter: Payer: Self-pay | Admitting: *Deleted

## 2019-01-21 NOTE — Telephone Encounter (Signed)
Patient has moved and I am not sure if she has found a new PCP.  I have sent a mychart message to follow-up with her.

## 2021-04-03 ENCOUNTER — Encounter: Payer: Self-pay | Admitting: *Deleted
